# Patient Record
Sex: Male | Born: 1965 | Race: White | Hispanic: No | Marital: Married | Smoking: Never smoker
Health system: Southern US, Community
[De-identification: ages and names within clinical notes are randomized; demographics above are authoritative.]

## PROBLEM LIST (undated history)

## (undated) HISTORY — PX: APPENDECTOMY: SHX54

---

## 2012-07-27 ENCOUNTER — Ambulatory Visit: Payer: BC Managed Care – PPO

## 2012-08-03 ENCOUNTER — Ambulatory Visit (INDEPENDENT_AMBULATORY_CARE_PROVIDER_SITE_OTHER): Payer: BC Managed Care – PPO | Admitting: Family Medicine

## 2012-08-03 VITALS — BP 127/82 | HR 72 | Temp 98.6°F | Resp 18 | Ht 64.5 in | Wt 225.0 lb

## 2012-08-03 DIAGNOSIS — Z0000 Encounter for general adult medical examination without abnormal findings: Secondary | ICD-10-CM

## 2012-08-03 LAB — COMPREHENSIVE METABOLIC PANEL
ALT: 25 U/L (ref 0–53)
CO2: 27 mEq/L (ref 19–32)
Calcium: 9.8 mg/dL (ref 8.4–10.5)
Chloride: 104 mEq/L (ref 96–112)
Creat: 0.94 mg/dL (ref 0.50–1.35)
Sodium: 140 mEq/L (ref 135–145)
Total Protein: 7.2 g/dL (ref 6.0–8.3)

## 2012-08-03 LAB — POCT CBC
Hemoglobin: 15.5 g/dL (ref 14.1–18.1)
MCH, POC: 29 pg (ref 27–31.2)
MPV: 10.4 fL (ref 0–99.8)
POC MID %: 7.6 %M (ref 0–12)
RBC: 5.34 M/uL (ref 4.69–6.13)
WBC: 6.8 10*3/uL (ref 4.6–10.2)

## 2012-08-03 LAB — GLUCOSE, POCT (MANUAL RESULT ENTRY): POC Glucose: 91 mg/dl (ref 70–99)

## 2012-08-03 LAB — TSH: TSH: 1.544 u[IU]/mL (ref 0.350–4.500)

## 2012-08-03 NOTE — Progress Notes (Signed)
Commercial Driver Medical Examination   Mason Branch is a 47 y.o. male who presents today for a commercial driver fitness determination physical exam. The patient reports no problems. The following portions of the patient's history were reviewed and updated as appropriate: allergies, current medications, past family history, past medical history, past social history, past surgical history and problem list. Wife is bipolar, does not know father.  Mother has HTN,  - has 3 children, 1 w/ ADHD - no other problems, daughter had gestational DM.  MGF died prostate cancer and MGM died with heart disease developed in her 70s.  PGF died of lung cancer, PGM unknown. 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 Granddaughters live with him. Review of Systems Integument/breast: negative except for bone spur on right forehead   Objective:    Vision:  Uncorrected Corrected Horizontal Field of Vision  Right Eye 20/20 20/20 85 degrees  Left Eye  20/20 20/20 85 degrees  Both Eyes  20/20 20/20    Applicant can recognize and distinguish among traffic control signals and devices showing standard red, green, and amber colors.     Monocular Vision?: No   Hearing:   500 Hz 1000 Hz 2000 Hz 4000 Hz  Right Ear  passed whisper test passed whisper test passed whisper test passed whisper test  Left Ear  passed whisper test passed whisper test passed whisper test passed whisper test      BP 127/82  Pulse 72  Temp 98.6 F (37 C) (Oral)  Resp 18  Ht 5' 4.5" (1.638 m)  Wt 225 lb (102.059 kg)  BMI 38.02 kg/m2  SpO2 98%  General Appearance:    Alert, cooperative, no distress, appears stated age  Head:    Normocephalic, without obvious abnormality, atraumatic  Eyes:    PERRL, conjunctiva/corneas clear, EOM's intact, fundi    benign, both eyes       Ears:    Normal TM's and external ear canals, both ears  Nose:   Nares normal, septum midline, mucosa normal, no  drainage    or sinus tenderness  Throat:   Lips, mucosa, and tongue normal; teeth and gums normal  Neck:   Supple, symmetrical, trachea midline, no adenopathy;       thyroid:  No enlargement/tenderness/nodules; no carotid   bruit or JVD  Back:     Symmetric, no curvature, ROM normal, no CVA tenderness  Lungs:     Clear to auscultation bilaterally, respirations unlabored  Chest wall:    No tenderness or deformity  Heart:    Regular rate and rhythm, S1 and S2 normal, no murmur, rub   or gallop  Abdomen:     Soft, non-tender, bowel sounds active all four quadrants,    no masses, no organomegaly  Genitalia:    Normal male without lesion, discharge or tenderness  Rectal:    Extremities:   Extremities normal, atraumatic, no cyanosis or edema  Pulses:   2+ and symmetric all extremities  Skin:   Skin color, texture, turgor normal, no rashes or lesions  Lymph nodes:   Cervical, supraclavicular, and axillary nodes normal  Neurologic:   CNII-XII intact. Normal strength, sensation and reflexes      throughout    Labs: No results found for this basename: SPECGRAV, PROTEINUR, BILIRUBINUR, GLUCOSEU      Results for orders placed in visit on 08/03/12  GLUCOSE, POCT (MANUAL RESULT ENTRY)      Component  Value Range   POC Glucose 91  70 - 99 mg/dl  POCT GLYCOSYLATED HEMOGLOBIN (HGB A1C)      Component Value Range   Hemoglobin A1C 5.0    POCT CBC      Component Value Range   WBC 6.8  4.6 - 10.2 K/uL   Lymph, poc 2.4  0.6 - 3.4   POC LYMPH PERCENT 34.7  10 - 50 %L   MID (cbc) 0.5  0 - 0.9   POC MID % 7.6  0 - 12 %M   POC Granulocyte 3.9  2 - 6.9   Granulocyte percent 57.7  37 - 80 %G   RBC 5.34  4.69 - 6.13 M/uL   Hemoglobin 15.5  14.1 - 18.1 g/dL   HCT, POC 09.8  11.9 - 53.7 %   MCV 90.6  80 - 97 fL   MCH, POC 29.0  27 - 31.2 pg   MCHC 32.0  31.8 - 35.4 g/dL   RDW, POC 14.7     Platelet Count, POC 265  142 - 424 K/uL   MPV 10.4  0 - 99.8 fL     Assessment:    Healthy male exam.   Meets standards in 79 CFR 391.41;  qualifies for 2 year certificate.    Plan:    Medical examiners certificate completed and printed. Return as needed.    Goes to [employer]. Not fasting today so will not check lipids - needs done at f/u

## 2012-08-08 ENCOUNTER — Encounter: Payer: Self-pay | Admitting: Family Medicine

## 2013-05-15 ENCOUNTER — Ambulatory Visit: Payer: BC Managed Care – PPO

## 2013-05-15 ENCOUNTER — Ambulatory Visit (INDEPENDENT_AMBULATORY_CARE_PROVIDER_SITE_OTHER): Payer: BC Managed Care – PPO | Admitting: Family Medicine

## 2013-05-15 ENCOUNTER — Encounter: Payer: Self-pay | Admitting: Family Medicine

## 2013-05-15 VITALS — BP 120/76 | HR 116 | Temp 98.9°F | Resp 30 | Ht 63.5 in | Wt 225.0 lb

## 2013-05-15 DIAGNOSIS — R509 Fever, unspecified: Secondary | ICD-10-CM

## 2013-05-15 DIAGNOSIS — R0602 Shortness of breath: Secondary | ICD-10-CM

## 2013-05-15 DIAGNOSIS — R079 Chest pain, unspecified: Secondary | ICD-10-CM

## 2013-05-15 DIAGNOSIS — K219 Gastro-esophageal reflux disease without esophagitis: Secondary | ICD-10-CM

## 2013-05-15 DIAGNOSIS — R9431 Abnormal electrocardiogram [ECG] [EKG]: Secondary | ICD-10-CM

## 2013-05-15 DIAGNOSIS — R112 Nausea with vomiting, unspecified: Secondary | ICD-10-CM

## 2013-05-15 LAB — POCT UA - MICROSCOPIC ONLY: Mucus, UA: NEGATIVE

## 2013-05-15 LAB — POCT URINALYSIS DIPSTICK
Glucose, UA: NEGATIVE
Ketones, UA: NEGATIVE
Spec Grav, UA: 1.015

## 2013-05-15 LAB — POCT CBC
Granulocyte percent: 74.1 %G (ref 37–80)
HCT, POC: 47.6 % (ref 43.5–53.7)
Hemoglobin: 15.1 g/dL (ref 14.1–18.1)
MCV: 92.7 fL (ref 80–97)
RBC: 5.13 M/uL (ref 4.69–6.13)

## 2013-05-15 NOTE — Progress Notes (Signed)
[address]   [place], [place]  [address]   [phone] Subjective:    Patient ID: Mason Branch, male    DOB: 13-Jul-1965, 47 y.o.   MRN: [id number]  This chart was scribed for Ethelda Chick, MD by Blanchard Kelch, ED Scribe. The patient was seen in room 10. Patient's care was started at 7:20 PM.  Chief Complaint  Patient presents with  . Illness    Fatigue, vomitting, chills, feverish, and SOB all started Tues     HPI  Mason Branch is a 47 y.o. male who presents to office complaining of nausea and vomiting that started two days ago after voting. He states that he got home from the polls, ate some [employer] soup and went to bed but woke up an hour later vomiting. He vomited for a few hours and also experienced chills. He vomited at least three times that night. He has a history of GERD and says it has been happening often recently. However, he states that he has never vomited like this episode from GERD. He has been taking Zantac occasionally when he eats foods that may trigger GERD (such as lasagna), but does not take it on a daily basis.   He did not go to work yesterday because he was still experiencing chills. They subsided in the late afternoon after experiencing brief diaphoresis. He was able to work today but felt chills late morning. He didn't feel better after eating lunch and so he decided to come get checked out. His wife believes the symptoms can be attributed to stress. He does not have a thermometer so had not checked his temperature, but believes he has had a fever the past few days.   When he was walking up a hill at work today he experienced chest tightness and shortness of breath. He had intermittent shortness of breath throughout the day. He states that it also hurts in his throat when he coughs. He denies having a cough but states that he has been "hiccuping for breath." The shortness of breath is alleviated some when he is resting. He is coughing with deep breaths  He  denies rhinorrhea, ear pain, sore throat, congestion, diarrhea, leg swelling, calf pain or abdominal pain.  Yesterday all he ate was a PB&J and some french fries. All he drank yesterday was plenty of water and a glass of tea. Today he has had an egg and cheese biscuit, a taco, coffee and tea. He works in a Audiological [employer]. His parents are overweight but fairly healthy. He states his biological father was diagnosed with prostate cancer about three years ago. His mother has a past medical history of diabetes and kidney problems and may have heart problems but will not tell him specifics.   He has a past surgical history of an appendectomy but denies any other pertinent medical or surgical history. He has never had an EKG before.    No past medical history on file. Past Surgical History  Procedure Laterality Date  . Appendectomy     Family History  Problem Relation Age of Onset  . Hypertension Mother   . Heart attack Maternal Grandmother   . Hypertension Maternal Grandmother   . Cancer Maternal Grandfather     prostate  . Cancer Paternal Grandmother   . Cancer Paternal Grandfather    History   Social History  . Marital Status: Married    Spouse Name: N/A    Number of Children: N/A  .  Years of Education: N/A   Occupational History  . Not on file.   Social History Main Topics  . Smoking status: Never Smoker   . Smokeless tobacco: Not on file  . Alcohol Use: No  . Drug Use: No  . Sexual Activity: Yes     Comment: married   Other Topics Concern  . Not on file   Social History Narrative  . No narrative on file   No Known Allergies No current outpatient prescriptions on file prior to visit.   No current facility-administered medications on file prior to visit.     Review of Systems  Constitutional: Positive for fever and chills.  HENT: Negative for congestion, ear pain, rhinorrhea and sore throat.   Respiratory: Positive for chest tightness and shortness of breath.     Cardiovascular: Negative for leg swelling.  Gastrointestinal: Positive for vomiting. Negative for abdominal pain and diarrhea.  Musculoskeletal: Negative for myalgias.       Objective:   Physical Exam  Nursing note and vitals reviewed. Constitutional: He is oriented to person, place, and time. He appears well-developed and well-nourished. No distress.  HENT:  Head: Normocephalic and atraumatic.  Right Ear: Tympanic membrane, external ear and ear canal normal.  Left Ear: Tympanic membrane, external ear and ear canal normal.  Mouth/Throat: Oropharynx is clear and moist and mucous membranes are normal.  Eyes: EOM are normal.  Neck: Normal range of motion. Neck supple. No tracheal deviation present. No thyromegaly present.  Cardiovascular: Normal rate, regular rhythm and normal heart sounds.   No murmur heard. Pulmonary/Chest: Effort normal and breath sounds normal. No respiratory distress. He has no wheezes. He has no rhonchi. He has no rales.  Musculoskeletal: Normal range of motion.  Lymphadenopathy:    He has no cervical adenopathy.  Neurological: He is alert and oriented to person, place, and time.  Skin: Skin is warm and dry.  Psychiatric: He has a normal mood and affect. His behavior is normal.    Results for orders placed in visit on 05/15/13  POCT CBC      Result Value Range   WBC 10.7 (*) 4.6 - 10.2 K/uL   Lymph, poc 2.3  0.6 - 3.4   POC LYMPH PERCENT 21.4  10 - 50 %L   MID (cbc) 0.5  0 - 0.9   POC MID % 4.5  0 - 12 %M   POC Granulocyte 7.9 (*) 2 - 6.9   Granulocyte percent 74.1  37 - 80 %G   RBC 5.13  4.69 - 6.13 M/uL   Hemoglobin 15.1  14.1 - 18.1 g/dL   HCT, POC 16.1  09.6 - 53.7 %   MCV 92.7  80 - 97 fL   MCH, POC 29.4  27 - 31.2 pg   MCHC 31.7 (*) 31.8 - 35.4 g/dL   RDW, POC 04.5     Platelet Count, POC 236  142 - 424 K/uL   MPV 10.0  0 - 99.8 fL  POCT URINALYSIS DIPSTICK      Result Value Range   Color, UA yellow     Clarity, UA clear     Glucose, UA  neg     Bilirubin, UA neg     Ketones, UA neg     Spec Grav, UA 1.015     Blood, UA neg     pH, UA 7.0     Protein, UA neg     Urobilinogen, UA 2.0     Nitrite,  UA neg     Leukocytes, UA Negative    POCT UA - MICROSCOPIC ONLY      Result Value Range   WBC, Ur, HPF, POC 2-4     RBC, urine, microscopic 0-1     Bacteria, U Microscopic trace     Mucus, UA neg     Epithelial cells, urine per micros 0-3     Crystals, Ur, HPF, POC neg     Casts, Ur, LPF, POC neg     Yeast, UA neg     [facility] reading (primary) by Dr. Katrinka Blazing: Chest x-ray- question pneumomediastinum. OVER-READ BY RADIOLOGY: NAD.  EKG:  NSR; T wave inversion/changes inferior leads.     Assessment & Plan:  Fever, unspecified  Shortness of breath - Plan: DG Chest 2 View  Nausea with vomiting - Plan: POCT CBC, POCT urinalysis dipstick, POCT UA - Microscopic Only, Comprehensive metabolic panel  Chest pain - Plan: DG Chest 2 View, EKG 12-Lead  1.  Chest pain:  New. Associated with SOB; exertional component; pt refused ED evaluation today; highly recommend evaluation by cardiology for stress testing.  Patient agreeable to present to ED for recurrent chest pain, SOB.  CXR negative; EKG abnormal. Pt to call with choice of cardiologist once discusses with wife. 2.  Fever: New. Associated with nausea, vomiting, chest pain, SOB.  Pt refused evaluation in ED tonight; viral etiology possible yet concern of exertional component to chest pain, SOB.  3.  Nausea with vomiting:  New. With subjective fever; recommend BRAT diet, hydration, rest. 4.  GERD: uncontrolled recently; recommend continued daily PPI and continued scheduled H2 blocker daily for 2-4 weeks.  Recommend weight loss and dietary modification. 5.  Abnormal EKG: New.  Highly recommend cardiology evaluation.  Patient to call regarding choice of cardiologist once he discusses with wife.   No orders of the defined types were placed in this encounter.    I personally performed  the services described in this documentation, which was scribed in my presence.  The recorded information has been reviewed and is accurate.  Mason Branch, M.D.  [facility] [address] [place], [place]  [address] [phone] phone [phone] fax

## 2013-05-15 NOTE — Patient Instructions (Addendum)
1.  Your EKG is slightly abnormal; I recommend a stress test to evaluate your heart better.   2.  Recommend rest, fluids.    Gastroesophageal Reflux Disease, Adult Gastroesophageal reflux disease (GERD) happens when acid from your stomach flows up into the esophagus. When acid comes in contact with the esophagus, the acid causes soreness (inflammation) in the esophagus. Over time, GERD may create small holes (ulcers) in the lining of the esophagus. CAUSES   Increased body weight. This puts pressure on the stomach, making acid rise from the stomach into the esophagus.  Smoking. This increases acid production in the stomach.  Drinking alcohol. This causes decreased pressure in the lower esophageal sphincter (valve or ring of muscle between the esophagus and stomach), allowing acid from the stomach into the esophagus.  Late evening meals and a full stomach. This increases pressure and acid production in the stomach.  A malformed lower esophageal sphincter. Sometimes, no cause is found. SYMPTOMS   Burning pain in the lower part of the mid-chest behind the breastbone and in the mid-stomach area. This may occur twice a week or more often.  Trouble swallowing.  Sore throat.  Dry cough.  Asthma-like symptoms including chest tightness, shortness of breath, or wheezing. DIAGNOSIS  Your caregiver may be able to diagnose GERD based on your symptoms. In some cases, X-rays and other tests may be done to check for complications or to check the condition of your stomach and esophagus. TREATMENT  Your caregiver may recommend over-the-counter or prescription medicines to help decrease acid production. Ask your caregiver before starting or adding any new medicines.  HOME CARE INSTRUCTIONS   Change the factors that you can control. Ask your caregiver for guidance concerning weight loss, quitting smoking, and alcohol consumption.  Avoid foods and drinks that make your symptoms worse, such  as:  Caffeine or alcoholic drinks.  Chocolate.  Peppermint or mint flavorings.  Garlic and onions.  Spicy foods.  Citrus fruits, such as oranges, lemons, or limes.  Tomato-based foods such as sauce, chili, salsa, and pizza.  Fried and fatty foods.  Avoid lying down for the 3 hours prior to your bedtime or prior to taking a nap.  Eat small, frequent meals instead of large meals.  Wear loose-fitting clothing. Do not wear anything tight around your waist that causes pressure on your stomach.  Raise the head of your bed 6 to 8 inches with wood blocks to help you sleep. Extra pillows will not help.  Only take over-the-counter or prescription medicines for pain, discomfort, or fever as directed by your caregiver.  Do not take aspirin, ibuprofen, or other nonsteroidal anti-inflammatory drugs (NSAIDs). SEEK IMMEDIATE MEDICAL CARE IF:   You have pain in your arms, neck, jaw, teeth, or back.  Your pain increases or changes in intensity or duration.  You develop nausea, vomiting, or sweating (diaphoresis).  You develop shortness of breath, or you faint.  Your vomit is green, yellow, black, or looks like coffee grounds or blood.  Your stool is red, bloody, or black. These symptoms could be signs of other problems, such as heart disease, gastric bleeding, or esophageal bleeding. MAKE SURE YOU:   Understand these instructions.  Will watch your condition.  Will get help right away if you are not doing well or get worse. Document Released: 04/05/2005 Document Revised: 09/18/2011 Document Reviewed: 01/13/2011 [facility] Patient Information 2014 [place], [place].

## 2013-05-16 ENCOUNTER — Ambulatory Visit: Payer: BC Managed Care – PPO | Admitting: Family Medicine

## 2013-05-16 LAB — COMPREHENSIVE METABOLIC PANEL
CO2: 25 mEq/L (ref 19–32)
Calcium: 9.4 mg/dL (ref 8.4–10.5)
Creat: 0.95 mg/dL (ref 0.50–1.35)
Glucose, Bld: 86 mg/dL (ref 70–99)
Sodium: 138 mEq/L (ref 135–145)
Total Bilirubin: 0.8 mg/dL (ref 0.3–1.2)
Total Protein: 7.6 g/dL (ref 6.0–8.3)

## 2013-05-21 ENCOUNTER — Encounter: Payer: Self-pay | Admitting: *Deleted

## 2014-07-11 ENCOUNTER — Ambulatory Visit (INDEPENDENT_AMBULATORY_CARE_PROVIDER_SITE_OTHER): Payer: BC Managed Care – PPO | Admitting: Family Medicine

## 2014-07-11 VITALS — BP 136/82 | HR 61 | Temp 98.0°F | Resp 19 | Ht 64.0 in | Wt 227.2 lb

## 2014-07-11 DIAGNOSIS — Z024 Encounter for examination for driving license: Secondary | ICD-10-CM

## 2014-07-11 DIAGNOSIS — Z0000 Encounter for general adult medical examination without abnormal findings: Secondary | ICD-10-CM

## 2014-07-11 NOTE — Progress Notes (Signed)
[facility] [address], [place] [address] [phone]  Date:  07/11/2014   Name:  Mason Branch   DOB:  01-12-1966   MRN:  [id number]  PCP:  Leo Grosser, MD    Chief Complaint: Annual Exam   History of Present Illness:  Mason Branch is a 49 y.o. 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  He is not aware of any chronic or current health issues, no medications.  No corrective lenses  There are no active problems to display for this patient.   History reviewed. No pertinent past medical history.  Past Surgical History  Procedure Laterality Date  . Appendectomy      History  Substance Use Topics  . Smoking status: Never Smoker   . Smokeless tobacco: Not on file  . Alcohol Use: No    Family History  Problem Relation Age of Onset  . Hypertension Mother   . Heart disease Mother   . Heart attack Maternal Grandmother   . Hypertension Maternal Grandmother   . Cancer Maternal Grandfather     prostate  . Cancer Paternal Grandmother   . Cancer Paternal Grandfather   . Cancer Father 96    prostate cancer     No Known Allergies  Medication list has been reviewed and updated.  No current outpatient prescriptions on file prior to visit.   No current facility-administered medications on file prior to visit.    Review of Systems:  As per HPI- otherwise negative.   Physical Examination: Filed Vitals:   07/11/14 1009  BP: 136/82  Pulse: 61  Temp: 98 F (36.7 C)  Resp: 19   Filed Vitals:   07/11/14 1009  Height:  (1.626 m)  Weight: 227 lb 3.2 oz (103.057 kg)   Body mass index is 38.98 kg/(m^2). Ideal Body Weight: Weight in (lb) to have BMI = 25: 145.3  GEN: WDWN, NAD, Non-toxic, A & O x 3, obese HEENT: Atraumatic, Normocephalic. Neck supple. No masses, No LAD.  Bilateral TM wnl, oropharynx normal.   PEERL,EOMI.   Ears and Nose: No external deformity. CV: RRR, No M/G/R. No JVD. No thrill. No extra heart sounds. PULM: CTA B, no wheezes, crackles, rhonchi. No retractions. No resp. distress. No accessory muscle use. ABD: S, NT, ND. No rebound. No HSM. EXTR: No c/c/e NEURO Normal gait.  PSYCH: Normally interactive. Conversant. Not depressed or anxious appearing.  Calm demeanor.  No inguinal hernia, normal strength and DTR of all extremities  Assessment and Plan: Physical exam  DOT exam as above,  2 year card.   Signed Abbe Amsterdam, MD

## 2015-11-25 ENCOUNTER — Ambulatory Visit (INDEPENDENT_AMBULATORY_CARE_PROVIDER_SITE_OTHER): Payer: BLUE CROSS/BLUE SHIELD | Admitting: Physician Assistant

## 2015-11-25 ENCOUNTER — Ambulatory Visit (INDEPENDENT_AMBULATORY_CARE_PROVIDER_SITE_OTHER): Payer: BLUE CROSS/BLUE SHIELD

## 2015-11-25 VITALS — BP 132/80 | HR 86 | Temp 98.7°F | Resp 16 | Ht 63.0 in | Wt 232.0 lb

## 2015-11-25 DIAGNOSIS — M25562 Pain in left knee: Secondary | ICD-10-CM | POA: Diagnosis not present

## 2015-11-25 NOTE — Patient Instructions (Signed)
     IF you received an x-ray today, you will receive an invoice from [facility]. Please contact [facility] at [phone] with questions or concerns regarding your invoice.   IF you received labwork today, you will receive an invoice from [facility]. Please contact [facility] at [phone] with questions or concerns regarding your invoice.   Our billing staff will not be able to assist you with questions regarding bills from these companies.  You will be contacted with the lab results as soon as they are available. The fastest way to get your results is to activate your My Chart account. Instructions are located on the last page of this paperwork. If you have not heard from us regarding the results in 2 weeks, please contact this office.      

## 2015-11-25 NOTE — Progress Notes (Signed)
11/25/2015 6:27 PM   DOB: 1966/05/20 / MRN: [id number]  SUBJECTIVE:  Mason Branch L Mckeag is a 50 y.o. male presenting for left sided knee pain that has been present for a few months now.  States the pain is worst in the morning and takes him about 3 hours to get the arm warmed up.  Complains that it will also hurt after a few minutes of sitting, but eventually warms up again.  He wants to avoid medication.  He has a bony cystic lesion on his right forehead and has been present since he was 20 and it growing larger.  He does not wish to have it removed.   He has No Known Allergies.   He  has no past medical history on file.    He  reports that he has never smoked. He has never used smokeless tobacco. He reports that he does not drink alcohol or use illicit drugs. He  reports that he currently engages in sexual activity. The patient  has past surgical history that includes Appendectomy.  His family history includes Cancer in his maternal grandfather, paternal grandfather, and paternal grandmother; Cancer (age of onset: 4965) in his father; Heart attack in his maternal grandmother; Heart disease in his mother; Hypertension in his maternal grandmother and mother.  Review of Systems  Constitutional: Negative for fever and chills.  Musculoskeletal: Positive for myalgias and joint pain. Negative for back pain and neck pain.  Skin: Negative for itching and rash.  Neurological: Negative for dizziness and headaches.    Problem list and medications reviewed and updated by myself where necessary, and exist elsewhere in the encounter.   OBJECTIVE:  BP 132/80 mmHg  Pulse 86  Temp(Src) 98.7 F (37.1 C) (Oral)  Resp 16  Ht 5\' 3"  (1.6 m)  Wt 232 lb (105.235 kg)  BMI 41.11 kg/m2  SpO2 97%  Physical Exam  Constitutional: He is oriented to person, place, and time. He appears well-developed. He does not appear ill.  Eyes: Conjunctivae and EOM are normal. Pupils are equal, round, and reactive to light.    Cardiovascular: Normal rate.   Pulmonary/Chest: Effort normal.  Abdominal: He exhibits no distension.  Musculoskeletal: Normal range of motion.       Legs: Neurological: He is alert and oriented to person, place, and time. No cranial nerve deficit. Coordination normal.  Skin: Skin is warm and dry. He is not diaphoretic.  Psychiatric: He has a normal mood and affect.  Nursing note and vitals reviewed.   No results found for this or any previous visit (from the past 72 hour(s)).  No results found.  ASSESSMENT AND PLAN  Mason Branch was seen today for leg pain.  Diagnoses and all orders for this visit:  Left knee pain: Rads consistent with early arthritis.  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  Patient is pain free with this and will start using this daily and Capsacin.  If this plan fails to provide long term relief then will get him to a physical therapist, as he does not want to take medication.  -     DG Knee 1-2 Views Left; Future  Lateral joint line tenderness of knee, left    The patient was advised to call or return to clinic if he does not see an improvement in symptoms or to seek the care of the closest emergency department if he worsens with the above plan.   Mason Branch, MHS, PA-C [facility] 11/25/2015 6:27 PM

## 2017-12-30 IMAGING — CR DG KNEE 1-2V*L*
2 series · 2 of 2 positions shown · non-contrast
Comparison: None.

CLINICAL DATA: Left knee pain

EXAM:
LEFT KNEE - 1-2 VIEW

[AP]
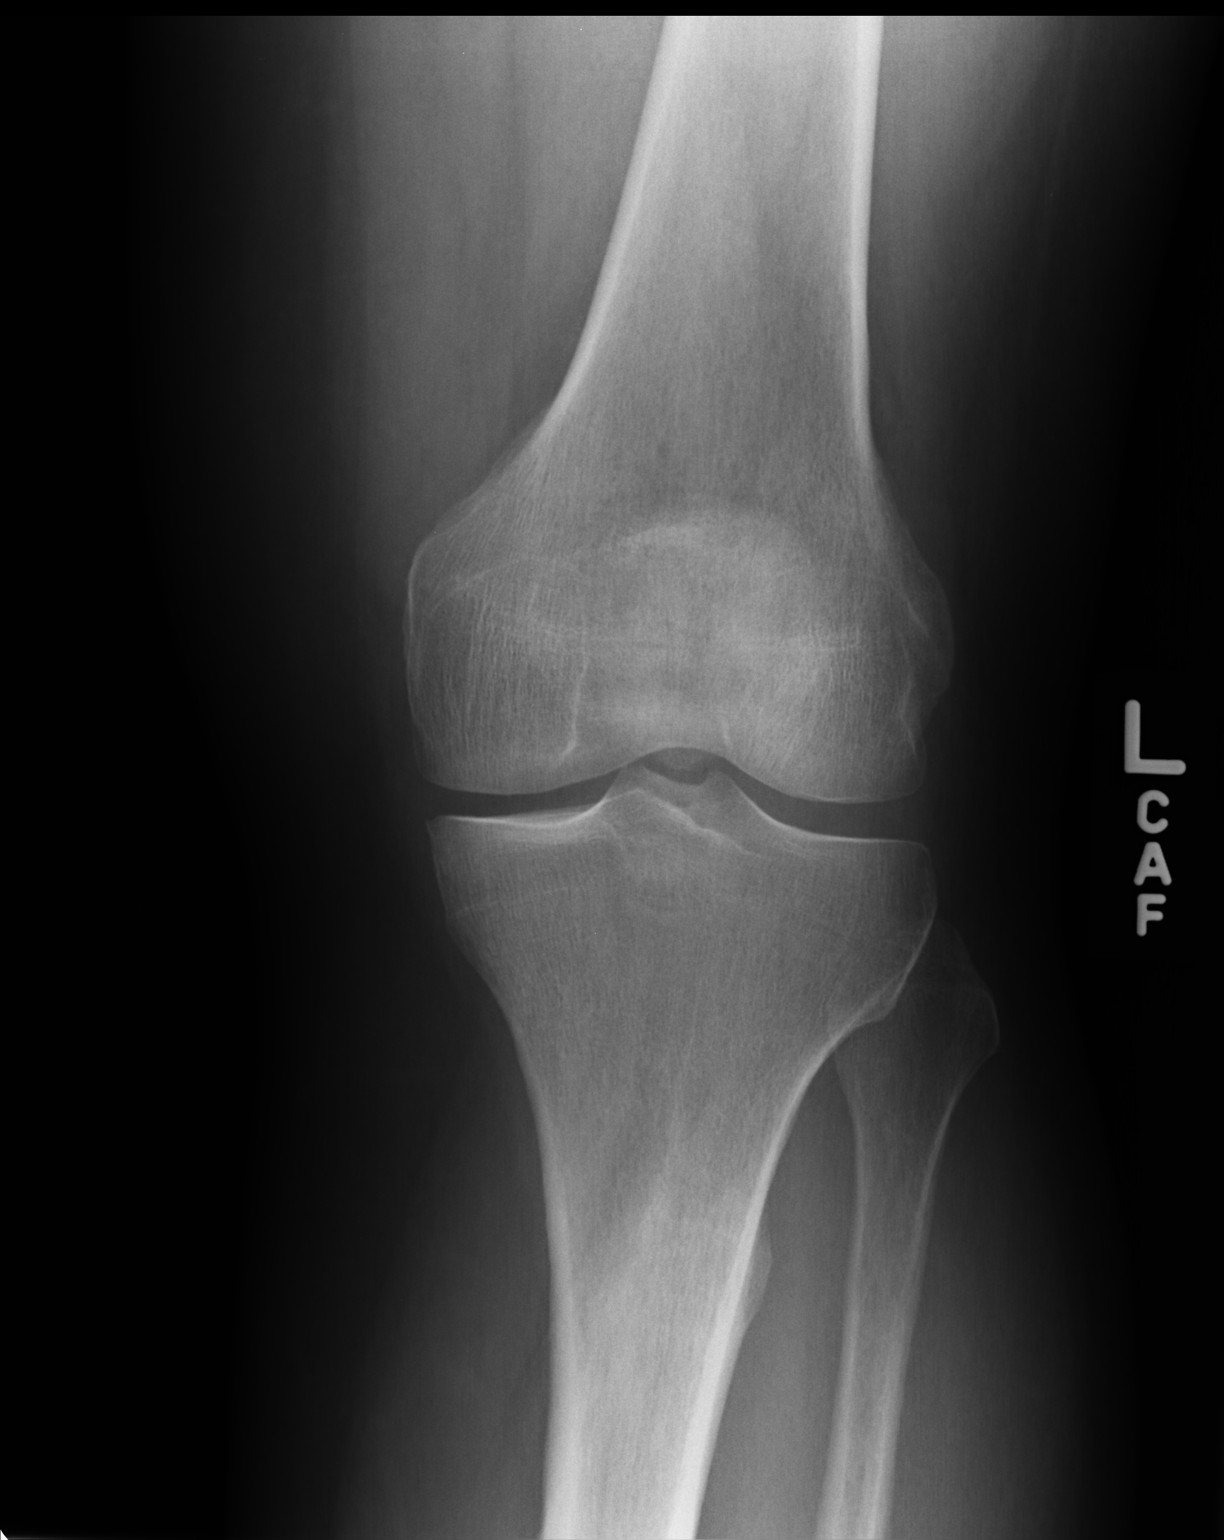

[lateral]
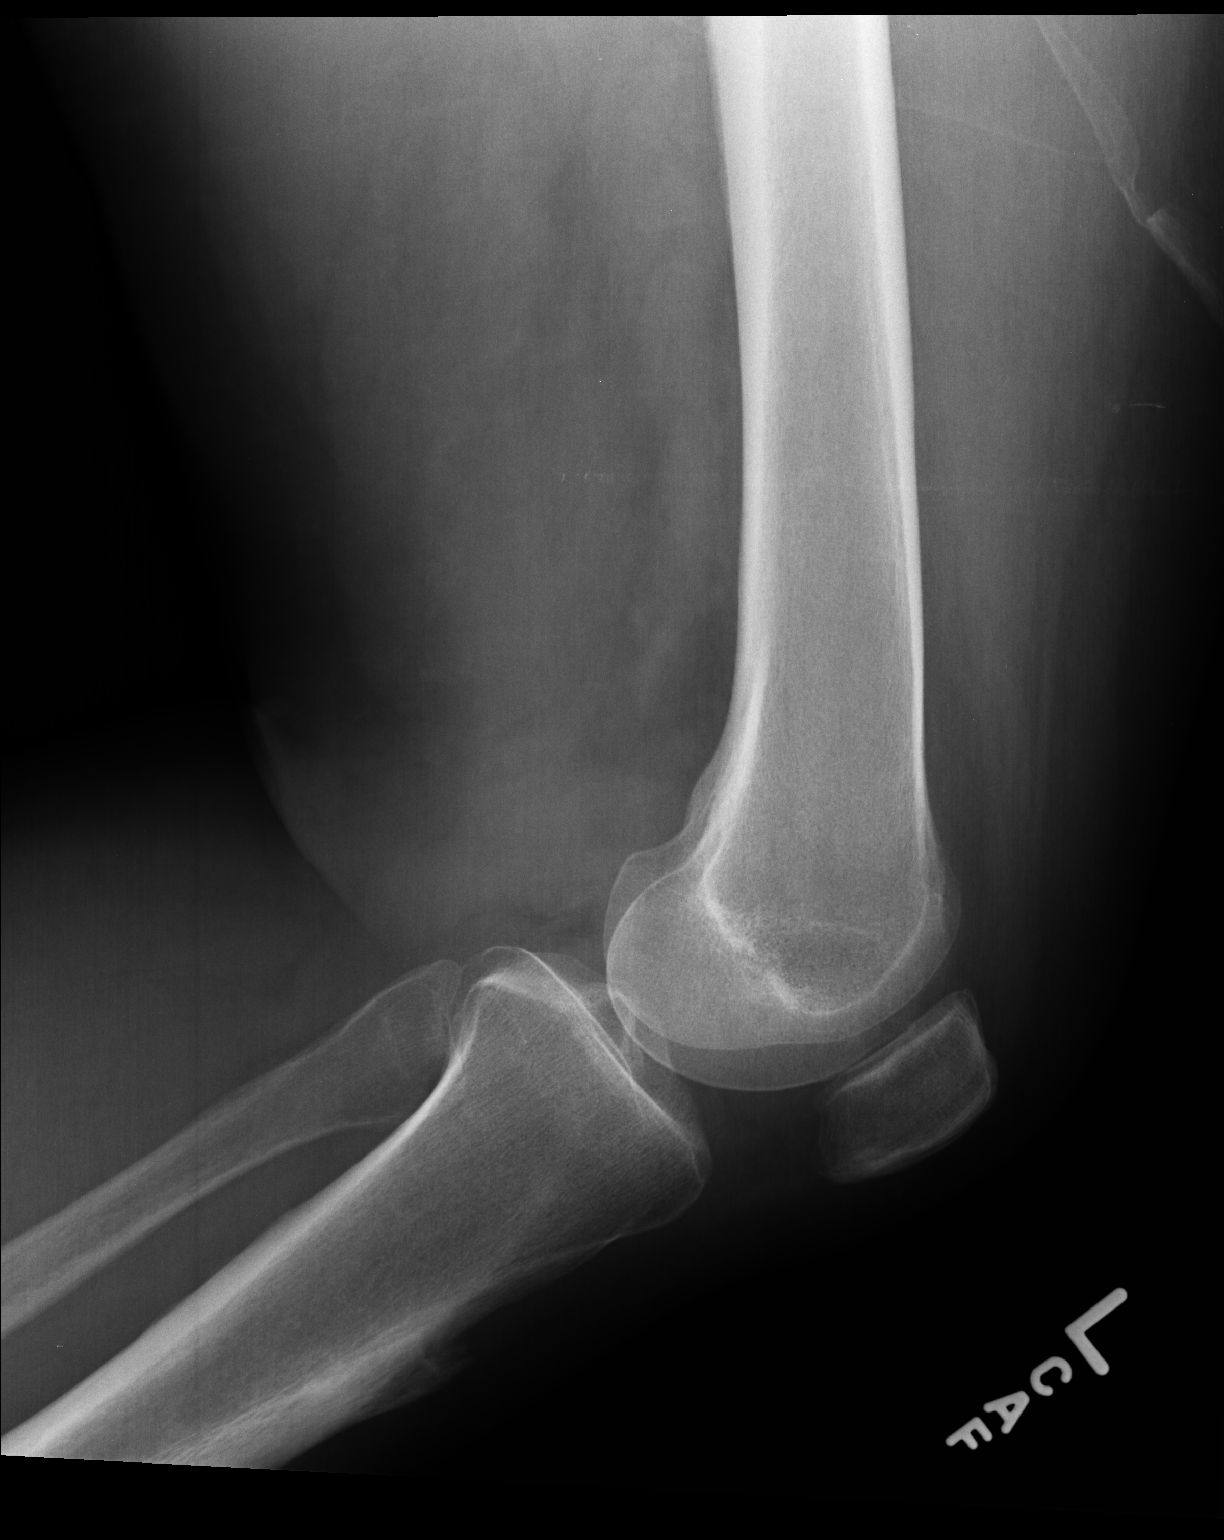

[2 of 2 positions shown; findings below may reference images not displayed]

FINDINGS: No evidence of fracture, dislocation, or joint effusion. No evidence
of arthropathy or other focal bone abnormality. Soft tissues are
unremarkable.
IMPRESSION: Negative.

## 2024-05-15 ENCOUNTER — Other Ambulatory Visit: Payer: Self-pay

## 2024-05-15 ENCOUNTER — Ambulatory Visit
Admission: EM | Admit: 2024-05-15 | Discharge: 2024-05-15 | Disposition: A | Discharge status: Home / Self Care | Attending: Emergency Medicine | Admitting: Emergency Medicine

## 2024-05-15 DIAGNOSIS — J029 Acute pharyngitis, unspecified: Secondary | ICD-10-CM | POA: Diagnosis not present

## 2024-05-15 LAB — POCT RAPID STREP A (OFFICE): Rapid Strep A Screen: NEGATIVE

## 2024-05-15 NOTE — Discharge Instructions (Signed)
 You can try once daily zyrtec (cetirizine) for any sinus/ear discomfort Ibuprofen can be alternated with tylenol for pain Make sure you are drinking lots of fluids! Salt water gargles, lozenges, or throat spray  Monitor symptoms for several days. Any worsening or new pain you can always return

## 2024-05-15 NOTE — ED Provider Notes (Addendum)
 [facility]    CSN: [id number] Arrival date & time: 05/15/24  1613      History   Chief Complaint Chief Complaint  Patient presents with   Sore Throat   Ear Drainage    HPI Mason Branch is a 58 y.o. male.  Here with 2 day history of sore/dry throat Pain is mild, only occurs in the morning  Rated 1/10 currently Also feels his ears are draining. No ear pain or muffled hearing Denies fever, congestion, cough  No interventions yet  He is not sure if this is related to the fan being on when he sleeps No known sick contacts  History reviewed. No pertinent past medical history.  There are no active problems to display for this patient.   Past Surgical History:  Procedure Laterality Date   APPENDECTOMY         Home Medications    Prior to Admission medications   Not on File    Family History Family History  Problem Relation Age of Onset   Hypertension Mother    Heart disease Mother    Heart attack Maternal Grandmother    Hypertension Maternal Grandmother    Cancer Maternal Grandfather        prostate   Cancer Paternal Grandmother    Cancer Paternal Grandfather    Cancer Father 80       prostate cancer     Social History Social History   Tobacco Use   Smoking status: Never   Smokeless tobacco: Current   Tobacco comments:    Smokeless tobacco  Vaping Use   Vaping status: Never Used  Substance Use Topics   Alcohol use: No    Alcohol/week: 0.0 standard drinks of alcohol   Drug use: No     Allergies   Patient has no known allergies.   Review of Systems Review of Systems  As per HPI  Physical Exam Triage Vital Signs ED Triage Vitals  Encounter Vitals Group     BP 05/15/24 1637 111/75     Girls Systolic BP Percentile --      Girls Diastolic BP Percentile --      Boys Systolic BP Percentile --      Boys Diastolic BP Percentile --      Pulse Rate 05/15/24 1637 84     Resp 05/15/24 1637 17     Temp 05/15/24 1637 98.7 F  (37.1 C)     Temp Source 05/15/24 1637 Oral     SpO2 05/15/24 1637 95 %     Weight 05/15/24 1638 240 lb (108.9 kg)     Height 05/15/24 1638 5' 3 (1.6 m)     Head Circumference --      Peak Flow --      Pain Score 05/15/24 1636 1     Pain Loc --      Pain Education --      Exclude from Growth Chart --    No data found.  Updated Vital Signs BP 111/75 (BP Location: Right Arm)   Pulse 84   Temp 98.7 F (37.1 C) (Oral)   Resp 17   Ht 5' 3 (1.6 m)   Wt 240 lb (108.9 kg)   SpO2 95%   BMI 42.51 kg/m    Physical Exam Vitals and nursing note reviewed.  Constitutional:      General: He is not in acute distress.    Appearance: He is not ill-appearing.  HENT:  Right Ear: Tympanic membrane and ear canal normal.     Left Ear: Tympanic membrane and ear canal normal.     Nose: No congestion or rhinorrhea.     Mouth/Throat:     Mouth: Mucous membranes are moist.     Dentition: Abnormal dentition. Dental caries present. No dental tenderness, gingival swelling or dental abscesses.     Pharynx: Oropharynx is clear. Uvula midline. No pharyngeal swelling, oropharyngeal exudate or posterior oropharyngeal erythema.  Eyes:     Conjunctiva/sclera: Conjunctivae normal.  Cardiovascular:     Rate and Rhythm: Normal rate and regular rhythm.     Heart sounds: Normal heart sounds.  Pulmonary:     Effort: Pulmonary effort is normal.     Breath sounds: Normal breath sounds.  Musculoskeletal:     Cervical back: Normal range of motion.  Lymphadenopathy:     Cervical: No cervical adenopathy.  Skin:    General: Skin is warm and dry.  Neurological:     Mental Status: He is alert and oriented to person, place, and time.     UC Treatments / Results  Labs (all labs ordered are listed, but only abnormal results are displayed) Labs Reviewed  POCT RAPID STREP A (OFFICE) - Normal    EKG  Radiology No results found.  Procedures Procedures   Medications Ordered in UC Medications - No  data to display  Initial Impression / Assessment and Plan / UC Course  I have reviewed the triage vital signs and the nursing notes.  Pertinent labs & imaging results that were available during my care of the patient were reviewed by me and considered in my medical decision making (see chart for details).  Afebrile, well appearing Rapid strep negative. Defer culture, Centor score 0 Discussed supportive care for mild symptoms  Ibuprofen/tylenol for pain, salt water gargles, lozenges, daily zyrtec for post-nasal drip, can try flonase for ear symptoms. Return if needed Agrees to plan  Abnormal dentition on exam Has never seen a dentist Dental resources given   Final Clinical Impressions(s) / UC Diagnoses   Final diagnoses:  Sore throat     Discharge Instructions      You can try once daily zyrtec (cetirizine) for any sinus/ear discomfort Ibuprofen can be alternated with tylenol for pain Make sure you are drinking lots of fluids! Salt water gargles, lozenges, or throat spray  Monitor symptoms for several days. Any worsening or new pain you can always return      ED Prescriptions   None    PDMP not reviewed this encounter.   Brooklyn Alfredo, PA-C 05/15/24 1708    Karianna Gusman, [place], PA-C 05/15/24 1708

## 2024-05-15 NOTE — ED Triage Notes (Signed)
 Pt presents with a chief complaint of sore throat x 2 days. Pain is mainly in the morning time and when speaking. Currently rates pain a 1/10. Sore throat is accompanied with bilateral ear drainage. No medications taken PTA for symptoms reported. Spouse sleeps with fan on. Unsure of sick contacts/fevers.
